# Patient Record
Sex: Female | Born: 1955 | Race: Asian | Hispanic: No | Marital: Married | Smoking: Never smoker
Health system: Southern US, Community
[De-identification: ages and names within clinical notes are randomized; demographics above are authoritative.]

## PROBLEM LIST (undated history)

## (undated) DIAGNOSIS — E785 Hyperlipidemia, unspecified: Secondary | ICD-10-CM

## (undated) DIAGNOSIS — K219 Gastro-esophageal reflux disease without esophagitis: Secondary | ICD-10-CM

## (undated) DIAGNOSIS — D649 Anemia, unspecified: Secondary | ICD-10-CM

## (undated) DIAGNOSIS — F419 Anxiety disorder, unspecified: Secondary | ICD-10-CM

---

## 1998-07-16 ENCOUNTER — Encounter: Admission: RE | Admit: 1998-07-16 | Discharge: 1998-08-16 | Payer: Self-pay | Admitting: *Deleted

## 1999-11-10 ENCOUNTER — Emergency Department (HOSPITAL_COMMUNITY): Admission: EM | Admit: 1999-11-10 | Discharge: 1999-11-10 | Payer: Self-pay | Admitting: Emergency Medicine

## 2001-08-26 ENCOUNTER — Encounter: Admission: RE | Admit: 2001-08-26 | Discharge: 2001-08-26 | Payer: Self-pay | Admitting: Gastroenterology

## 2001-08-26 ENCOUNTER — Encounter: Payer: Self-pay | Admitting: Gastroenterology

## 2001-10-19 ENCOUNTER — Encounter (INDEPENDENT_AMBULATORY_CARE_PROVIDER_SITE_OTHER): Payer: Self-pay | Admitting: Specialist

## 2001-10-19 ENCOUNTER — Ambulatory Visit (HOSPITAL_COMMUNITY): Admission: RE | Admit: 2001-10-19 | Discharge: 2001-10-19 | Payer: Self-pay | Admitting: Gastroenterology

## 2002-10-02 ENCOUNTER — Emergency Department (HOSPITAL_COMMUNITY): Admission: EM | Admit: 2002-10-02 | Discharge: 2002-10-02 | Payer: Self-pay

## 2004-10-24 ENCOUNTER — Emergency Department (HOSPITAL_COMMUNITY): Admission: EM | Admit: 2004-10-24 | Discharge: 2004-10-24 | Payer: Self-pay | Admitting: Emergency Medicine

## 2004-11-18 ENCOUNTER — Ambulatory Visit (HOSPITAL_COMMUNITY): Admission: RE | Admit: 2004-11-18 | Discharge: 2004-11-18 | Payer: Self-pay | Admitting: Gastroenterology

## 2004-11-18 ENCOUNTER — Encounter (INDEPENDENT_AMBULATORY_CARE_PROVIDER_SITE_OTHER): Payer: Self-pay | Admitting: Specialist

## 2005-02-09 ENCOUNTER — Emergency Department (HOSPITAL_COMMUNITY): Admission: EM | Admit: 2005-02-09 | Discharge: 2005-02-09 | Payer: Self-pay | Admitting: Emergency Medicine

## 2005-03-10 ENCOUNTER — Ambulatory Visit (HOSPITAL_COMMUNITY): Admission: RE | Admit: 2005-03-10 | Discharge: 2005-03-10 | Payer: Self-pay | Admitting: Internal Medicine

## 2005-04-07 ENCOUNTER — Ambulatory Visit: Admission: RE | Admit: 2005-04-07 | Discharge: 2005-04-07 | Payer: Self-pay | Admitting: Thoracic Surgery

## 2005-04-09 ENCOUNTER — Ambulatory Visit: Payer: Self-pay | Admitting: Internal Medicine

## 2006-06-15 ENCOUNTER — Emergency Department (HOSPITAL_COMMUNITY): Admission: EM | Admit: 2006-06-15 | Discharge: 2006-06-15 | Payer: Self-pay | Admitting: Emergency Medicine

## 2007-07-23 ENCOUNTER — Emergency Department (HOSPITAL_COMMUNITY): Admission: EM | Admit: 2007-07-23 | Discharge: 2007-07-23 | Payer: Self-pay | Admitting: Family Medicine

## 2007-12-16 ENCOUNTER — Encounter: Admission: RE | Admit: 2007-12-16 | Discharge: 2007-12-16 | Payer: Self-pay | Admitting: Family Medicine

## 2009-01-17 ENCOUNTER — Encounter: Admission: RE | Admit: 2009-01-17 | Discharge: 2009-01-17 | Payer: Self-pay | Admitting: Internal Medicine

## 2009-02-13 ENCOUNTER — Encounter: Admission: RE | Admit: 2009-02-13 | Discharge: 2009-03-01 | Payer: Self-pay | Admitting: Internal Medicine

## 2009-03-12 ENCOUNTER — Emergency Department (HOSPITAL_COMMUNITY): Admission: EM | Admit: 2009-03-12 | Discharge: 2009-03-12 | Payer: Self-pay | Admitting: Emergency Medicine

## 2010-03-23 ENCOUNTER — Encounter (HOSPITAL_BASED_OUTPATIENT_CLINIC_OR_DEPARTMENT_OTHER): Payer: Self-pay | Admitting: Internal Medicine

## 2010-03-24 ENCOUNTER — Encounter: Payer: Self-pay | Admitting: Orthopedic Surgery

## 2010-05-18 LAB — DIFFERENTIAL
Basophils Absolute: 0 10*3/uL (ref 0.0–0.1)
Basophils Relative: 0 % (ref 0–1)
Lymphocytes Relative: 29 % (ref 12–46)
Lymphs Abs: 2.4 10*3/uL (ref 0.7–4.0)
Monocytes Absolute: 0.5 10*3/uL (ref 0.1–1.0)
Monocytes Relative: 6 % (ref 3–12)
Neutro Abs: 5.1 10*3/uL (ref 1.7–7.7)
Neutrophils Relative %: 61 % (ref 43–77)

## 2010-05-18 LAB — COMPREHENSIVE METABOLIC PANEL
ALT: 124 U/L — ABNORMAL HIGH (ref 0–35)
AST: 67 U/L — ABNORMAL HIGH (ref 0–37)
Albumin: 4.2 g/dL (ref 3.5–5.2)
Alkaline Phosphatase: 174 U/L — ABNORMAL HIGH (ref 39–117)
CO2: 28 mEq/L (ref 19–32)
Calcium: 9.8 mg/dL (ref 8.4–10.5)
Chloride: 103 mEq/L (ref 96–112)
Glucose, Bld: 113 mg/dL — ABNORMAL HIGH (ref 70–99)
Potassium: 3.8 mEq/L (ref 3.5–5.1)

## 2010-05-18 LAB — URINALYSIS, ROUTINE W REFLEX MICROSCOPIC
Glucose, UA: NEGATIVE mg/dL
Hgb urine dipstick: NEGATIVE
Protein, ur: NEGATIVE mg/dL
pH: 7.5 (ref 5.0–8.0)

## 2010-05-18 LAB — CBC
MCHC: 34.7 g/dL (ref 30.0–36.0)
MCV: 95.1 fL (ref 78.0–100.0)
WBC: 8.3 10*3/uL (ref 4.0–10.5)

## 2010-05-18 LAB — LIPASE, BLOOD: Lipase: 60 U/L — ABNORMAL HIGH (ref 11–59)

## 2010-07-18 NOTE — Op Note (Signed)
Emma Taylor, DUIGNAN             ACCOUNT NO.:  [id number]   MEDICAL RECORD NO.:  [id number]          PATIENT TYPE:  AMB   LOCATION:  ENDO                         FACILITY:  [facility]   PHYSICIAN:  Petra Kuba, M.D.    DATE OF BIRTH:  [birth year]   DATE OF PROCEDURE:  11/18/2004  DATE OF DISCHARGE:                                 OPERATIVE REPORT   PROCEDURE:  Colonoscopy.   INDICATIONS:  Iron deficiency.  Due for colonic screening.   Consent was signed after risks, benefits, methods, and options thoroughly  discussed in the office.   No sedation was used, based on patient preference.   PROCEDURE:  Rectal inspection is pertinent for external hemorrhoids, small.  Digital exam was negative.  The video pediatric adjustable colonoscope was  inserted and was fairly easily advanced around the colon to the cecum.  This  did require some abdominal pressure but no position changes.  Other than  some left-sided diverticula, no abnormalities were seen on insertion.  The  cecum was identified by the appendiceal orifice and the ileocecal valve.  The scope was inserted shortways into the terminal ileum, which was normal.  Photo documentation was obtained.  No blood was seen on insertion or in the  GI.  The scope was slowly withdrawn.  The prep was adequate.  There was some  liquid stool that required washing and suctioning.  On slow withdrawal  through the colon, other than some left-sided diverticula and some  tortuosity, no polypoid lesions, masses, AVM's, or other abnormalities were  seen as we slowly withdrew back to the rectum.  Anorectal pull-through and  retroflexion confirmed some small hemorrhoids.  The scope was straightened  and readvanced shortways up the left side of the colon.  Air was suctioned.  The scope removed.  The patient tolerated the procedure well.  There was no  obvious immediate complication.   ENDOSCOPIC DIAGNOSES:  1.  Internal/external hemorrhoids, small.  2.   Left-sided diverticula tortuosity.  3.  Otherwise within normal limits to the terminal ileum without any blood      being seen.   PLAN:  Continue workup with an EGD.  Recheck colon screening in five years.           ______________________________  Petra Kuba, M.D.     MEM/MEDQ  D:  11/18/2004  T:  11/18/2004  Job:  [id number]   cc:   Gabriel Earing, M.D.  [address]  [place]  [place] [address]  Fax: [phone]

## 2010-07-18 NOTE — Op Note (Signed)
[name], [name]             ACCOUNT NO.:  [id number]   MEDICAL RECORD NO.:  [id number]           PATIENT TYPE:   LOCATION:                                 FACILITY:   PHYSICIAN:  Petra Kuba, M.D.         DATE OF BIRTH:   DATE OF PROCEDURE:  DATE OF DISCHARGE:                                 OPERATIVE REPORT   PROCEDURE:  Esophagogastroduodenoscopy with polypectomy.   ENDOSCOPIST:  Petra Kuba, M.D.   INDICATIONS:  Patient with iron deficiency, nondiagnostic colonoscopy  history of gastric polyp.  No  medicines were given per patient preference.  Consent was signed after risks, benefits, methods, options thoroughly  discussed in the office.   DESCRIPTION OF PROCEDURE:  The video endoscope was inserted by direct  vision.  The esophagus was normal. She did have a small to medium sized  hiatal hernia.  The scope passed into the stomach and probably some atrophic  gastritis was seen along the distal greater curve almost to the antrum.  Two  moderate size pedunculated polyps were seen.  It did seem to have ulcerated  tend to the scope was inserted through the normal antrum, normal pylorus  into a normal duodenal bulb and around the C-loop to a normal second portion  of the duodenum.  We went ahead and took two biopsies of the duodenum to  rule out any malabsorption problem.  The scope was then withdrawn slowly  back to the bulb again confirming its normal appearance.  The scope was  withdrawn back to the stomach and retroflexed.  The  cardia, fundus,  angularis, lesser and greater curve were all normal on retroflexed  visualization except for some mild atrophic gastritis.  Straight  visualization of the stomach did not reveal any additional findings.  We  went ahead and withdrew the scope back to about 20 cm, which again confirmed  a normal esophagus.  We went ahead and advance the scope to the polyp, and  we did try to use the Endoloop but unfortunately the Endoloop was  malfunctioning, and we elected not to use it.  She did pulled the scope out  once at this juncture, which required reintubation.  She had minimal  difficulty holding air but we asked her at this juncture to hold the air.  The polyp was very carefully snared.  Electrocautery was applied, and the  polyp was removed.  We went ahead and used the [facility] retrieval mitt to grab  the polyp, and the polyp and scope were removed, and the polyp was  recovered.  We reinserted the scope.  The polypectomy site had a nice white  coagulum but it did not seem to be too deep.  We went ahead and put two  clips on the polypectomy site to try to prevent bleeding down the road.  This was done using the two prong clip in the customary fashion without  problems.  The air was suctioned, scope slowly withdrawn. The patient  tolerated the procedure well.  There was no obvious immediate complication.   ENDOSCOPIC DIAGNOSES:  1.  Small to medium hiatal hernia.  2.  Distal greater curve moderate ulcerated polyp status post snare x1 with      seemingly complete removal and empiric clip of the base x2.  3.  Otherwise normal EGD status post duodenal biopsies except for some      possible mild atrophic gastritis.   PLAN:  1.  Await pathology.  2.  No aspirin or nonsteroidals.  3.  Recheck outpatient guaiacs in one month as well as probably repeat a CBC      at that junction.  4.  Continue her iron in the meantime and no aspirin or nonsteroidals and      use Prilosec OTC.  5.  Call me sooner p.r.n.           ______________________________  Petra Kuba, M.D.     MEM/MEDQ  D:  11/18/2004  T:  11/18/2004  Job:  [id number]   cc:   Emma Taylor., M.D.  [address], [address]  [place]  [place] [address]  Fax: [phone]

## 2010-07-18 NOTE — Op Note (Signed)
   TNAMETIMEA, BREED                 ACCOUNT NO.:  [id number]   MEDICAL RECORD NO.:  [id number]                   PATIENT TYPE:  AMB   LOCATION:  ENDO                                 FACILITY:  [facility]   PHYSICIAN:  Petra Kuba, M.D.                 DATE OF BIRTH:  Oct 05, 1955   DATE OF PROCEDURE:  DATE OF DISCHARGE:                                 OPERATIVE REPORT   PROCEDURE:  Esophagogastroduodenoscopy with biopsy.   INDICATIONS FOR PROCEDURE:  Upper tract symptoms not responding to the usual  medicine.  Consent was signed after risks, benefits, methods, and options  were thoroughly discussed in the office.   No sedation was given per patient request.   DESCRIPTION OF PROCEDURE:  The video endoscope was inserted by direct  vision. The esophagus was normal. In the distal esophagus was a tiny hiatal  hernia. The scope passed into the stomach and advanced through the antrum  pertinent for some minimal antritis through a normal pylorus into a normal  duodenal and around the C loop to a normal second portion of the duodenum.  The scope was withdrawn back to the bulb and a good look there ruled out  ulcers in that location. The scope was withdrawn back to the stomach and  retroflexed. The cardia, fundus, proximal, lesser and greater curve were  normal as was the angularis. The scope was straightened and straight  visualization on the distal greater curve was a small pedunculated polyp  which was cold biopsied multiple times. We also took a few biopsies of the  antrum to rule out Helicobacter. Air was suctioned, scope slowly withdrawn.  Again a good look at the esophagus was normal except for the tiny hiatal  hernia. The scope was removed. The patient tolerated the procedure well.  There was no obvious or immediate complication.   ENDOSCOPIC DIAGNOSIS:  1. Tiny hiatal hernia.  2. Minimal antritis status post biopsy.  3. Greater curve small pedunculated polyp status post  biopsy.  4. Otherwise within normal limits EGD.   PLAN:  B.I.D. Protonix since that seems to be helping. Await pathology but  as long as nothing worrisome consider repeat endoscopy either in one year or  proceed with polypectomy and otherwise follow-up p.r.n. or probably in two  months to recheck symptoms and make sure no further workup plans are needed.  Based on not having her office chart with me, I will need to send a copy of  this to her primary care physician when I have that chart.                                               Petra Kuba, M.D.    MEM/MEDQ  D:  10/19/2001  T:  10/19/2001  Job:  [id number]

## 2010-07-28 ENCOUNTER — Emergency Department (HOSPITAL_COMMUNITY)
Admission: EM | Admit: 2010-07-28 | Discharge: 2010-07-29 | Disposition: A | Discharge status: Home / Self Care | Payer: 59 | Attending: Emergency Medicine | Admitting: Emergency Medicine

## 2010-07-28 DIAGNOSIS — IMO0001 Reserved for inherently not codable concepts without codable children: Secondary | ICD-10-CM | POA: Insufficient documentation

## 2010-07-28 DIAGNOSIS — S90569A Insect bite (nonvenomous), unspecified ankle, initial encounter: Secondary | ICD-10-CM | POA: Insufficient documentation

## 2015-06-27 DIAGNOSIS — R42 Dizziness and giddiness: Secondary | ICD-10-CM | POA: Diagnosis not present

## 2015-06-27 DIAGNOSIS — Z0000 Encounter for general adult medical examination without abnormal findings: Secondary | ICD-10-CM | POA: Diagnosis not present

## 2015-06-27 DIAGNOSIS — E785 Hyperlipidemia, unspecified: Secondary | ICD-10-CM | POA: Diagnosis not present

## 2015-06-27 DIAGNOSIS — Z862 Personal history of diseases of the blood and blood-forming organs and certain disorders involving the immune mechanism: Secondary | ICD-10-CM | POA: Diagnosis not present

## 2015-07-13 DIAGNOSIS — E785 Hyperlipidemia, unspecified: Secondary | ICD-10-CM | POA: Diagnosis not present

## 2015-09-30 DIAGNOSIS — H5213 Myopia, bilateral: Secondary | ICD-10-CM | POA: Diagnosis not present

## 2015-09-30 DIAGNOSIS — H524 Presbyopia: Secondary | ICD-10-CM | POA: Diagnosis not present

## 2016-01-06 DIAGNOSIS — R945 Abnormal results of liver function studies: Secondary | ICD-10-CM | POA: Diagnosis not present

## 2016-01-06 DIAGNOSIS — Z13220 Encounter for screening for lipoid disorders: Secondary | ICD-10-CM | POA: Diagnosis not present

## 2016-01-06 DIAGNOSIS — D509 Iron deficiency anemia, unspecified: Secondary | ICD-10-CM | POA: Diagnosis not present

## 2016-04-06 MED FILL — NYSTATIN 100,000 UNITS/ML S: 100000 | 3 days supply | Qty: 60 | Fill #0

## 2016-04-13 MED FILL — NYSTATIN 100,000 UNITS/ML S: 100000 | 23 days supply | Qty: 473 | Fill #0

## 2016-05-11 DIAGNOSIS — K317 Polyp of stomach and duodenum: Secondary | ICD-10-CM | POA: Diagnosis not present

## 2016-05-11 DIAGNOSIS — R945 Abnormal results of liver function studies: Secondary | ICD-10-CM | POA: Diagnosis not present

## 2016-05-11 DIAGNOSIS — K219 Gastro-esophageal reflux disease without esophagitis: Secondary | ICD-10-CM | POA: Diagnosis not present

## 2016-05-11 DIAGNOSIS — Z0000 Encounter for general adult medical examination without abnormal findings: Secondary | ICD-10-CM | POA: Diagnosis not present

## 2016-05-11 DIAGNOSIS — E785 Hyperlipidemia, unspecified: Secondary | ICD-10-CM | POA: Diagnosis not present

## 2016-05-25 DIAGNOSIS — Z1211 Encounter for screening for malignant neoplasm of colon: Secondary | ICD-10-CM | POA: Diagnosis not present

## 2016-05-25 DIAGNOSIS — Z1212 Encounter for screening for malignant neoplasm of rectum: Secondary | ICD-10-CM | POA: Diagnosis not present

## 2016-06-17 DIAGNOSIS — H524 Presbyopia: Secondary | ICD-10-CM | POA: Diagnosis not present

## 2016-06-17 DIAGNOSIS — H5213 Myopia, bilateral: Secondary | ICD-10-CM | POA: Diagnosis not present

## 2016-07-21 DIAGNOSIS — R1013 Epigastric pain: Secondary | ICD-10-CM | POA: Diagnosis not present

## 2016-07-23 DIAGNOSIS — R29898 Other symptoms and signs involving the musculoskeletal system: Secondary | ICD-10-CM | POA: Diagnosis not present

## 2016-08-17 DIAGNOSIS — Z1151 Encounter for screening for human papillomavirus (HPV): Secondary | ICD-10-CM | POA: Diagnosis not present

## 2016-08-17 DIAGNOSIS — Z01419 Encounter for gynecological examination (general) (routine) without abnormal findings: Secondary | ICD-10-CM | POA: Diagnosis not present

## 2016-08-17 DIAGNOSIS — Z1231 Encounter for screening mammogram for malignant neoplasm of breast: Secondary | ICD-10-CM | POA: Diagnosis not present

## 2016-08-17 DIAGNOSIS — Z6820 Body mass index (BMI) 20.0-20.9, adult: Secondary | ICD-10-CM | POA: Diagnosis not present

## 2016-10-14 ENCOUNTER — Other Ambulatory Visit: Payer: Self-pay | Admitting: Cardiology

## 2016-10-14 ENCOUNTER — Emergency Department (HOSPITAL_COMMUNITY): Payer: 59

## 2016-10-14 ENCOUNTER — Emergency Department (HOSPITAL_COMMUNITY)
Admission: EM | Admit: 2016-10-14 | Discharge: 2016-10-14 | Disposition: A | Discharge status: Home / Self Care | Payer: 59 | Attending: Emergency Medicine | Admitting: Emergency Medicine

## 2016-10-14 ENCOUNTER — Encounter (HOSPITAL_COMMUNITY): Payer: Self-pay | Admitting: *Deleted

## 2016-10-14 DIAGNOSIS — R0602 Shortness of breath: Secondary | ICD-10-CM | POA: Diagnosis not present

## 2016-10-14 DIAGNOSIS — R9431 Abnormal electrocardiogram [ECG] [EKG]: Secondary | ICD-10-CM | POA: Diagnosis not present

## 2016-10-14 DIAGNOSIS — R079 Chest pain, unspecified: Secondary | ICD-10-CM | POA: Diagnosis not present

## 2016-10-14 DIAGNOSIS — R51 Headache: Secondary | ICD-10-CM | POA: Diagnosis not present

## 2016-10-14 DIAGNOSIS — F419 Anxiety disorder, unspecified: Secondary | ICD-10-CM | POA: Diagnosis not present

## 2016-10-14 DIAGNOSIS — E782 Mixed hyperlipidemia: Secondary | ICD-10-CM | POA: Diagnosis not present

## 2016-10-14 DIAGNOSIS — Z862 Personal history of diseases of the blood and blood-forming organs and certain disorders involving the immune mechanism: Secondary | ICD-10-CM | POA: Diagnosis not present

## 2016-10-14 DIAGNOSIS — R072 Precordial pain: Secondary | ICD-10-CM | POA: Insufficient documentation

## 2016-10-14 DIAGNOSIS — R519 Headache, unspecified: Secondary | ICD-10-CM

## 2016-10-14 DIAGNOSIS — E785 Hyperlipidemia, unspecified: Secondary | ICD-10-CM | POA: Insufficient documentation

## 2016-10-14 DIAGNOSIS — R0789 Other chest pain: Secondary | ICD-10-CM

## 2016-10-14 DIAGNOSIS — Z79899 Other long term (current) drug therapy: Secondary | ICD-10-CM | POA: Insufficient documentation

## 2016-10-14 HISTORY — DX: Anxiety disorder, unspecified: F41.9

## 2016-10-14 HISTORY — DX: Anemia, unspecified: D64.9

## 2016-10-14 HISTORY — DX: Gastro-esophageal reflux disease without esophagitis: K21.9

## 2016-10-14 HISTORY — DX: Hyperlipidemia, unspecified: E78.5

## 2016-10-14 LAB — BASIC METABOLIC PANEL
ANION GAP: 11 (ref 5–15)
BUN: 9 mg/dL (ref 6–20)
CALCIUM: 9.7 mg/dL (ref 8.9–10.3)
CHLORIDE: 103 mmol/L (ref 101–111)
CO2: 27 mmol/L (ref 22–32)
CREATININE: 0.63 mg/dL (ref 0.44–1.00)
GFR calc non Af Amer: >60 mL/min (ref >60)
Glucose, Bld: 187 mg/dL — ABNORMAL HIGH (ref 65–99)
Potassium: 3.9 mmol/L (ref 3.5–5.1)
SODIUM: 141 mmol/L (ref 135–145)

## 2016-10-14 LAB — HEPATIC FUNCTION PANEL
ALBUMIN: 4.3 g/dL (ref 3.5–5.0)
ALT: 28 U/L (ref 14–54)
AST: 35 U/L (ref 15–41)
Alkaline Phosphatase: 74 U/L (ref 38–126)
Bilirubin, Direct: <0.1 mg/dL — ABNORMAL LOW (ref 0.1–0.5)
TOTAL PROTEIN: 7.6 g/dL (ref 6.5–8.1)
Total Bilirubin: 1.3 mg/dL — ABNORMAL HIGH (ref 0.3–1.2)

## 2016-10-14 LAB — CBC
HCT: 44.2 % (ref 36.0–46.0)
HEMOGLOBIN: 14.6 g/dL (ref 12.0–15.0)
MCH: 31.1 pg (ref 26.0–34.0)
MCHC: 33 g/dL (ref 30.0–36.0)
MCV: 94.2 fL (ref 78.0–100.0)
PLATELETS: 281 10*3/uL (ref 150–400)
RBC: 4.69 MIL/uL (ref 3.87–5.11)
RDW: 11.5 % (ref 11.5–15.5)
WBC: 6.6 10*3/uL (ref 4.0–10.5)

## 2016-10-14 LAB — I-STAT TROPONIN, ED
TROPONIN I, POC: 0 ng/mL (ref 0.00–0.08)
Troponin i, poc: 0 ng/mL (ref 0.00–0.08)

## 2016-10-14 LAB — LIPASE, BLOOD: LIPASE: 59 U/L — AB (ref 11–51)

## 2016-10-14 MED ORDER — ASPIRIN 81 MG PO CHEW: 81.0000 mg | CHEWABLE_TABLET | Freq: Every day | ORAL | 0 refills | Status: AC

## 2016-10-14 MED ORDER — PANTOPRAZOLE SODIUM 20 MG PO TBEC: 20.0000 mg | DELAYED_RELEASE_TABLET | Freq: Every day | ORAL | 0 refills | Status: AC

## 2016-10-14 MED ORDER — ASPIRIN 81 MG PO CHEW
324.0000 mg | CHEWABLE_TABLET | Freq: Once | ORAL | Status: AC
  Administered 2016-10-14: 324 mg via ORAL
  Filled 2016-10-14: qty 4

## 2016-10-14 NOTE — Consult Note (Signed)
Cardiology Consult:   Patient ID: Emma Taylor; MRN: [id number]; DOB: May 31, 1955   Admission date: 10/14/2016  Primary Care Provider: Marden Noble, MD Primary Cardiologist: New   Chief Complaint:  Chest pain  Patient Profile:    Emma Taylor is a 61 y.o. female who is being seen today for the evaluation of chest pain at the request of Dr. Donnald Garre. Emma Taylor has a history of hyperlipidemia treated with red yeast rice, anemia, GERD and anxiety. Emma Taylor presented to [facility] urgent care for evaluation of chest pain over the last 2 weeks and was referred to [facility] ED for further evaluation.   History of Present Illness:   Emma Taylor first noted chest pain that is substernal pressure, nonradiating and associated with numbness of all 4 extremities about 2 weeks ago. The chest pain is constant with periods of increased intensity that last for a few hours. Emma Taylor has no palpitations, diaphoresis, shortness of breath. Emma Taylor does have weakness, occasional lightheadedness and occasional loss of balance. Emma Taylor can push on her sternal borders and reproduce the pain. Emma Taylor also has increased discomfort with leaning forward. Emma Taylor noted that last Tuesday Emma Taylor developed a panicky feeling and her friend calmed her down by helping her take deep breaths. Emma Taylor has a history of panic attacks but has not had any in the last 10 years. The numbness of her extremities, weakness and panicky feeling is similar to her previous panic attacks except for the new chest pain. Her discomfort is not necessarily related to exertion and Emma Taylor denies any orthopnea, PND or edema. Last night Emma Taylor again had the panicky feeling and did not sleep well. Emma Taylor presented to urgent care today for evaluation.   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 Emma Taylor does not like driving in the traffic and Emma Taylor is very stressed about  this.  Patient is a non-smoker and does not drink alcohol. Emma Taylor has no history of hypertension or diabetes. Emma Taylor has no known cardiac history. Emma Taylor has no significant family history of CAD. Emma Taylor does have a fairly strong family history of cancer.  Significant findings: Troponin 0.00,  0.00 EKG: NSR 82 BPM, non-specific ST changes esp laterally, Prolonged QTC 551 SCr 0.63,  K+  3.9 Hgb 14.6,   WBC 6.6 CXR: no edema or consolidation  Past Medical History:  Diagnosis Date  . Anemia   . Anxiety   . GERD (gastroesophageal reflux disease)   . Hyperlipidemia     Past Surgical History:  Procedure Laterality Date  . CESAREAN SECTION       Medications Prior to Admission: Prior to Admission medications   Medication Sig Start Date End Date Taking? Authorizing Provider  cholecalciferol (VITAMIN D) 1000 units tablet Take 1,000 Units by mouth daily.   Yes [provider]  magnesium 30 MG tablet Take 30 mg by mouth 2 (two) times daily.   Yes [provider]  vitamin C (ASCORBIC ACID) 500 MG tablet Take 500 mg by mouth daily.   Yes [provider]     Allergies:    Allergies  Allergen Reactions  . Iohexol Other (See Comments)     Desc: pt states Emma Taylor had reaction to contrast media in past, states Emma Taylor had "cardiac overload" and near syncope.     Social History:   Social History   Social History  . Marital status: Married  Spouse name: N/A  . Number of children: N/A  . Years of education: N/A   Occupational History  . IV nurse [employer]   Social History Main Topics  . Smoking status: Never Smoker  . Smokeless tobacco: Never Used  . Alcohol use No  . Drug use: No  . Sexual activity: Not on file   Other Topics Concern  . Not on file   Social History Narrative  . No narrative on file    Family History:   The patient's family history includes Alzheimer's disease in her mother; Breast cancer in her sister; Diabetes in her mother; Hypertension in her  mother; Lung cancer in her sister; Pancreatic cancer in her brother; Stomach cancer in her father; Stroke in her sister.    ROS:  Please see the history of present illness.  All other ROS reviewed and negative.     Physical Exam/Data:   Vitals:   10/14/16 1115 10/14/16 1145 10/14/16 1445 10/14/16 1500  BP: (!) 177/99 115/79 (!) 128/45 135/81  Pulse: 77 64 70 75  Resp: 20 16 19 17   Temp:      TempSrc:      SpO2: 100% 98% 100% 98%  Weight:      Height:       No intake or output data in the 24 hours ending 10/14/16 1531 Filed Weights   10/14/16 1010  Weight: 97 lb (44 kg)   Body mass index is 20.27 kg/m.  General:  Well nourished, well developed, in no acute distress HEENT: normal Lymph: no adenopathy Neck: no JVD Endocrine:  No thryomegaly Vascular: No carotid bruits; FA pulses 2+ bilaterally without bruits  Cardiac:  normal S1, S2; RRR; no murmur  Lungs:  clear to auscultation bilaterally, no wheezing, rhonchi or rales  Abd: soft, nontender, no hepatomegaly  Ext: no edema Musculoskeletal:  No deformities, BUE and BLE strength normal and equal Skin: warm and dry  Neuro:  CNs 2-12 intact, no focal abnormalities noted Psych:  Normal affect    EKG:  The ECG that was done was personally reviewed and demonstrates  NSR 82 BPM, non-specific ST changes esp laterally, Prolonged QTC 551- QT does not look that long, looks like around 400 EKG done at urgent care reviewed: NSR at 63. No ST changes. QTC 413  Relevant CV Studies: none  Laboratory Data:  Chemistry  Recent Labs Lab 10/14/16 1014  NA 141  K 3.9  CL 103  CO2 27  GLUCOSE 187*  BUN 9  CREATININE 0.63  CALCIUM 9.7  GFRNONAA >60  GFRAA >60  ANIONGAP 11     Recent Labs Lab 10/14/16 1014  PROT 7.6  ALBUMIN 4.3  AST 35  ALT 28  ALKPHOS 74  BILITOT 1.3*   Hematology  Recent Labs Lab 10/14/16 1014  WBC 6.6  RBC 4.69  HGB 14.6  HCT 44.2  MCV 94.2  MCH 31.1  MCHC 33.0  RDW 11.5  PLT 281    Cardiac EnzymesNo results for input(s): TROPONINI in the last 168 hours.   Recent Labs Lab 10/14/16 1046 10/14/16 1418  TROPIPOC 0.00 0.00    BNPNo results for input(s): BNP, PROBNP in the last 168 hours.  DDimer No results for input(s): DDIMER in the last 168 hours.  Radiology/Studies:  Dg Chest 2 View  Result Date: 10/14/2016 CLINICAL DATA:  Chest pain ; shortness of breath EXAM: CHEST  2 VIEW COMPARISON:  June 05, 2006 FINDINGS: Lungs are clear. Heart size and pulmonary  vascularity are normal. No adenopathy. No pneumothorax. No bone lesions. IMPRESSION: No edema or consolidation. Electronically Signed   By: Bretta Bang III M.D.   On: 10/14/2016 11:06    Assessment and Plan:   1. Atypical Chest pain -Chest pain is atypical, non-radiating, reproducible, also having extremity numbness and panicky feelings. -Troponins negative X2 -EKG is with non-specific ST changes. EKG at urgent care shows NSR without ST changes, QTC 413 -CXR normal -CVD risk factors include only hyperlipidemia. -With no objective ischemia and low suspicion for ACS, offered pt a Cardiac CT for calcium scoring and morphology with FFR, however, Emma Taylor apparently had a reaction to IV contrast many years ago and does not want this test. Emma Taylor does agree to have an outpatient exercise nuclear stress test. I will arrange e this through the office.   2. Hyperlipidemia -Lipid panel in 07/2015 TC-202, LDL-136, HDL 51, Trig- 72, Non-HDL -151   Severity of Illness: ER consult.   Signed, Berton Bon, NP  10/14/2016 3:31 PM   The patient was seen, examined and discussed with Berton Bon, NP-C and I agree with the above.    61 y.o. female With past medical history of hyperlipidemia we are seeing today for evaluation of chest pain. Patient has no family history of premature coronary artery disease no prior cardiac history herself Emma Taylor has never smoked and doesn't drink. Emma Taylor has been using red yeast rice for  hyperlipidemia, Emma Taylor also has history of gastric reflux and anxiety. Emma Taylor presented to the urgent care with chest pain that started about 2 weeks ago it has been persistent and reproducible on the left side of her chest. Sometimes Emma Taylor would wake up at night and feel like her jaw is hurting as well. Her pain is not worse on exertion and he doesn't get relief at rest. Emma Taylor has no diaphoresis no palpitations or syncope. Emma Taylor denies any orthopnea or lower extremity edema. Her EKG shows sinus bradycardia and is otherwise normal. Troponin negative 2, her physical exam is unremarkable with normal JVD is regular rate and rhythm and clear lungs and no peripheral edema which strong pulses. Considering Emma Taylor is low to intermediate risk, Emma Taylor was offered to undergo coronary CTA, however Emma Taylor [place] push injection that lead to syncopal episode and Emma Taylor is anxious to get anything IV since then. Emma Taylor doesn't wish to have CT scan done. Emma Taylor is offered to do a stress test, Emma Taylor doesn't want to stay overnight and makes it clear that if Emma Taylor had a stress test has to be an exercise one as Emma Taylor doesn't want to be injected Lexiscan. Emma Taylor wishes to go home tonight, Emma Taylor'll be discharged from the ER, we will arrange for an outpatient exercise nuclear stress test in our office.  Tobias Alexander, MD 10/14/2016

## 2016-10-14 NOTE — ED Notes (Signed)
Upon discharge, pt requesting CT scan of her head due to continued headaches and family hx of strokes and brain tumor.

## 2016-10-14 NOTE — ED Notes (Signed)
Pt resting; fresh warm blankets given; no needs.

## 2016-10-14 NOTE — ED Provider Notes (Signed)
[facility]-EMERGENCY DEPT Provider Note   CSN: [id number] Arrival date & time: 10/14/16  [id number]     History   Chief Complaint Chief Complaint  Patient presents with  . Chest Pain  . Shortness of Breath    HPI Emma Taylor is a 61 y.o. female.  HPI Patient has been getting intermittent chest pain for about 2 weeks. She reports at onset she experienced chest pain with achiness in her jaws. The pain has been waxing and waning and predominantly substernal. She does note some exertional dyspnea and fatigue. She particularly noted this morning when she awakened she had pain in the left upper chest that was radiating toward the shoulder with associated shortness of breath. Patient does work as an Advertising copywriterV nurse at the hospital. She was spending the past 2 weeks "observing" the quality of pain to determine if she thought it needed evaluation. This morning she did determined to go to urgent care for evaluation. She was referred to the emergency department for further chest pain workup. Patient has not had a peripheral edema or calf tenderness. No fever, no cough, no hemoptysis. No known history of cardiac disease. Patient reports that she has hypercholesterolemia that she treats with red yeast extract. Nonsmoker. Patient for she typically does not have hypertension, she thinks blood pressures are somewhat elevated emergency department due to anxiety.  Past Medical History:  Diagnosis Date  . Anemia   . Anxiety   . GERD (gastroesophageal reflux disease)   . Hyperlipidemia     There are no active problems to display for this patient.   Past Surgical History:  Procedure Laterality Date  . CESAREAN SECTION      OB History    No data available       Home Medications    Prior to Admission medications   Medication Sig Start Date End Date Taking? Authorizing Provider  cholecalciferol (VITAMIN D) 1000 units tablet Take 1,000 Units by mouth daily.   Yes [provider]  magnesium  30 MG tablet Take 30 mg by mouth 2 (two) times daily.   Yes [provider]  vitamin C (ASCORBIC ACID) 500 MG tablet Take 500 mg by mouth daily.   Yes [provider]  aspirin 81 MG chewable tablet Chew 1 tablet (81 mg total) by mouth daily. 10/14/16   Arby BarrettePfeiffer, Lakeisha Waldrop, MD  pantoprazole (PROTONIX) 20 MG tablet Take 1 tablet (20 mg total) by mouth daily. 10/14/16   Arby BarrettePfeiffer, Xitlally Mooneyham, MD    Family History Family History  Problem Relation Age of Onset  . Alzheimer's disease Mother   . Diabetes Mother   . Hypertension Mother   . Stomach cancer Father   . Stroke Sister   . Lung cancer Sister   . Pancreatic cancer Brother   . Breast cancer Sister     Social History Social History  Substance Use Topics  . Smoking status: Never Smoker  . Smokeless tobacco: Never Used  . Alcohol use No     Allergies   Iohexol   Review of Systems Review of Systems 10 Systems reviewed and are negative for acute change except as noted in the HPI.   Physical Exam Updated Vital Signs BP 127/78   Pulse 68   Temp 98.1 F (36.7 C) (Oral)   Resp 16   Ht 4\' 10"  (1.473 m)   Wt 44 kg (97 lb)   SpO2 98%   BMI 20.27 kg/m   Physical Exam  Constitutional: She appears well-developed and  well-nourished. No distress.  HENT:  Head: Normocephalic and atraumatic.  Mouth/Throat: Oropharynx is clear and moist.  Eyes: Conjunctivae and EOM are normal.  Neck: Neck supple.  Cardiovascular: Normal rate, regular rhythm, normal heart sounds and intact distal pulses.   No murmur heard. Pulmonary/Chest: Effort normal and breath sounds normal. No respiratory distress.  Abdominal: Soft. She exhibits no distension. There is no tenderness. There is no guarding.  Musculoskeletal: Normal range of motion. She exhibits no edema or tenderness.  No calf tenderness and no peripheral edema. Skin condition very good.  Neurological: She is alert.  Skin: Skin is warm and dry.  Psychiatric: She has a normal mood  and affect.  Nursing note and vitals reviewed.    ED Treatments / Results  Labs (all labs ordered are listed, but only abnormal results are displayed) Labs Reviewed  BASIC METABOLIC PANEL - Abnormal; Notable for the following:       Result Value   Glucose, Bld 187 (*)    All other components within normal limits  HEPATIC FUNCTION PANEL - Abnormal; Notable for the following:    Total Bilirubin 1.3 (*)    Bilirubin, Direct <0.1 (*)    All other components within normal limits  LIPASE, BLOOD - Abnormal; Notable for the following:    Lipase 59 (*)    All other components within normal limits  CBC  I-STAT TROPONIN, ED  I-STAT TROPONIN, ED    EKG  EKG Interpretation  Date/Time:  Wednesday October 14 2016 10:00:20 EDT Ventricular Rate:  82 PR Interval:  142 QRS Duration: 86 QT Interval:  472 QTC Calculation: 551 R Axis:   69 Text Interpretation:  Critical Test Result: Long QTc Normal sinus rhythm Low voltage QRS Nonspecific ST and T wave abnormality Abnormal ECG Confirmed by Arby Barrette [phone]) on 10/14/2016 10:43:06 AM       Radiology Dg Chest 2 View  Result Date: 10/14/2016 CLINICAL DATA:  Chest pain ; shortness of breath EXAM: CHEST  2 VIEW COMPARISON:  June 05, 2006 FINDINGS: Lungs are clear. Heart size and pulmonary vascularity are normal. No adenopathy. No pneumothorax. No bone lesions. IMPRESSION: No edema or consolidation. Electronically Signed   By: Bretta Bang III M.D.   On: 10/14/2016 11:06    Procedures Procedures (including critical care time)  Medications Ordered in ED Medications  aspirin chewable tablet 324 mg (324 mg Oral Given 10/14/16 1115)     Initial Impression / Assessment and Plan / ED Course  I have reviewed the triage vital signs and the nursing notes.  Pertinent labs & imaging results that were available during my care of the patient were reviewed by me and considered in my medical decision making (see chart for details).      Consult: (11:25) cardiology for evaluation emergency department.  Final Clinical Impressions(s) / ED Diagnoses   Final diagnoses:  Precordial chest pain  Abnormal EKG  Acute nonintractable headache, unspecified headache type   Patient has been seen by cardiology. 2 sets of cardiac enzymes are negative. They have made an arrangement plan for the patient to get exercise stress test on Monday. I have reviewed with patient signs and symptoms first return. She is alert and nontoxic. Patient does however report prior to discharge that she has had a generalized headache today. She reports is atypical for her to have headaches. He has no associated neurologic symptoms. She is worried because she has had a friend and family member with headache have bad outcome. At  this time, will proceed with CT scan. CT negative plan is to discharge a patient with cardiology follow-up as arranged. New Prescriptions New Prescriptions   ASPIRIN 81 MG CHEWABLE TABLET    Chew 1 tablet (81 mg total) by mouth daily.   PANTOPRAZOLE (PROTONIX) 20 MG TABLET    Take 1 tablet (20 mg total) by mouth daily.     Arby Barrette, MD 10/14/16 [phone]

## 2016-10-14 NOTE — ED Triage Notes (Signed)
Pt sent here from [facility] for chest pain x 2 weeks.  Sternal chest pressure now radiates to L chest and is accompanied by sob.

## 2016-10-14 NOTE — ED Notes (Signed)
Pt ambulatory to restroom independently with no distress

## 2016-10-14 NOTE — ED Notes (Signed)
Pt ambulated to restroom without distress.  

## 2016-10-14 NOTE — ED Notes (Signed)
ED Provider at bedside. 

## 2016-10-14 NOTE — ED Notes (Signed)
Patient transported to X-ray 

## 2016-10-14 NOTE — ED Notes (Signed)
Warm blankets given; no needs; tucked in

## 2016-10-14 NOTE — ED Notes (Signed)
Cards at bedside. Pt reports headache starting this morning that is worsening. MD informed.

## 2016-10-14 NOTE — Discharge Instructions (Signed)
1. You have a stress test scheduled for Monday 10/19/16 at 9:45. See instructions below. 2. Take Protonix daily to protect her stomach from ulcer inflammation. Take a daily baby aspirin until you have completed your testing for heart related symptoms. 3. Return to the emergency department if her symptoms are worsening or new symptoms are developing.    You have a Stress Test scheduled at [facility]. Your doctor has ordered this test to check the blood flow in your heart arteries.  Please arrive 15 minutes early for paperwork. The whole test will take several hours. You may want to bring reading material to remain occupied while undergoing different parts of the test.  Instructions:  No food/drink after midnight the night before.  It is OK to take your morning meds with a sip of water EXCEPT for those types of medicines listed below or otherwise instructed.  No caffeine/decaf products 24 hours before, including medicines such as Excedrin or Goody Powders. Call if there are any questions.   Wear comfortable clothes and shoes.   Special Medication Instructions:  Beta blockers such as metoprolol (Lopressor/Toprol XL), atenolol (Tenormin), carvedilol (Coreg), nebivolol (Bystolic), bisoprolol (Zebeta), propranolol (Inderal) should not be taken for 24 hours before the test.  Calcium channel blockers such as diltiazem (Cardizem) or verapmil (Calan) should not be taken for 24 hours before the test.  Remove nitroglycerin patches and do not take nitrate preparations such as Imdur/isosorbide the day of your test.  No Persantine/Theophylline or Aggrenox medicines should be used within 24 hours of the test.   If you are diabetic, please ask which medications to hold the day of the test.  What To Expect: When you arrive in the lab, the technician will inject a small amount of radioactive tracer into your arm through an IV while you are resting quietly. This helps [place] to form  pictures of your heart. You will likely only feel a sting from the IV. After a waiting period, resting pictures will be obtained under a big camera. These are the "before" pictures.  Next, you will be prepped for the stress portion of the test. This may include either walking on a treadmill or receiving a medicine that helps to dilate blood vessels in your heart to simulate the effect of exercise on your heart. If you are walking on a treadmill, you will walk at different paces to try to get your heart rate to a goal number that is based on your age. If your doctor has chosen the pharmacologic test, then you will receive a medicine through your IV that may cause temporary nausea, flushing, shortness of breath and sometimes chest discomfort or vomiting. This is typically short-lived and usually resolves quickly. If you experience symptoms, that does not automatically mean the test is abnormal. Some patients do not experience any symptoms at all. Your blood pressure and heart rate will be monitored, and we will be watching your EKG on a computer screen for any changes. During this portion of the test, the radiologist will inject another small amount of radioactive tracer into your IV. After a waiting period, you will undergo a second set of pictures. These are the "after" pictures.  The doctor reading the test will compare the before-and-after images to look for evidence of heart blockages or heart weakness. The test usually takes 1 day to complete, but in certain instances (for example, if a patient is over a certain weight limit), the test may be done over the  span of 2 days.

## 2016-10-14 NOTE — ED Notes (Signed)
Rn called CT, 3 pts ahead of her

## 2016-10-15 ENCOUNTER — Telehealth (HOSPITAL_COMMUNITY): Payer: Self-pay | Admitting: *Deleted

## 2016-10-15 NOTE — Telephone Encounter (Signed)
Attempted to call patient regarding upcoming appointment on 10/19/16. No answer x 2.  Emma Taylor, Emma Taylor

## 2016-10-19 ENCOUNTER — Encounter (HOSPITAL_COMMUNITY): Payer: Self-pay | Admitting: *Deleted

## 2016-10-19 ENCOUNTER — Encounter (HOSPITAL_COMMUNITY): Payer: Self-pay

## 2016-10-19 ENCOUNTER — Ambulatory Visit (HOSPITAL_COMMUNITY): Payer: 59

## 2016-10-19 NOTE — Progress Notes (Signed)
Patient arrived for nuclear stress test. Refused test. Does not want to reschedule

## 2016-10-26 ENCOUNTER — Encounter: Payer: Self-pay | Admitting: Physician Assistant

## 2016-10-26 ENCOUNTER — Ambulatory Visit (INDEPENDENT_AMBULATORY_CARE_PROVIDER_SITE_OTHER): Payer: 59 | Admitting: Physician Assistant

## 2016-10-26 VITALS — BP 180/90 | HR 81 | Ht <= 58 in | Wt 97.8 lb

## 2016-10-26 DIAGNOSIS — I10 Essential (primary) hypertension: Secondary | ICD-10-CM | POA: Insufficient documentation

## 2016-10-26 DIAGNOSIS — R079 Chest pain, unspecified: Secondary | ICD-10-CM | POA: Diagnosis not present

## 2016-10-26 DIAGNOSIS — F419 Anxiety disorder, unspecified: Secondary | ICD-10-CM | POA: Diagnosis not present

## 2016-10-26 DIAGNOSIS — E785 Hyperlipidemia, unspecified: Secondary | ICD-10-CM

## 2016-10-26 DIAGNOSIS — K219 Gastro-esophageal reflux disease without esophagitis: Secondary | ICD-10-CM | POA: Diagnosis not present

## 2016-10-26 NOTE — Patient Instructions (Signed)
Medication Instructions:  Your physician recommends that you continue on your current medications as directed. Please refer to the Current Medication list given to you today.   Labwork: -None  Testing/Procedures: Your physician has requested that you have an echocardiogram. Echocardiography is a painless test that uses sound waves to create images of your heart. It provides your doctor with information about the size and shape of your heart and how well your heart's chambers and valves are working. This procedure takes approximately one hour. There are no restrictions for this procedure.  Patient stated will call and schedule echo/HTN/CP.  Follow-Up: Your physician recommends that you schedule a follow-up appointment after patient has echo.  Patient will call office to schedule with Dr. Delton See when patient checks schedule.    Any Other Special Instructions Will Be Listed Below (If Applicable).  Keep track of your bp, make a list and bring in at your next office visit.     If you need a refill on your cardiac medications before your next appointment, please call your pharmacy.

## 2016-10-26 NOTE — Progress Notes (Signed)
Cardiology Office Note    Date:  10/26/2016   ID:  Emma, Taylor [birth year], MRN [id number]  PCP:  Marden Noble, MD  Cardiologist: Dr. Delton See  No chief complaint on file.   History of Present Illness:  Emma Taylor is a 61 y.o. female patient who was seen in the ER 10/14/16 by Dr. Delton See for chest pain. She has a history of hyperlipidemia treated with red yeast Rice, anemia, GERD and anxiety. She presented no one urgent care for evaluation of chest pain over the past 2 weeks and was referred to [facility] for further evaluation. 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 No history of hypertension, diabetes, nonsmoker and no family history of CAD. Troponins were negative EKG had nonspecific ST-T wave changes with prolonged QTC of 551. Chest pain was felt to be atypical. Cardiac CT for calcium scoring and morphology with FFR was offered but she had a reaction to IV contrast may years ago and declined that test. She agreed to an outpatient nuclear stress test which was ordered but she canceled this as well.  Patient is here for follow-up the nuclear stress test that she didn't have. She is asking [place] to clear her for family leave act. She only has chest pain if she doesn't sleep or is under stress. It associated with palpitations and panic attacks and anxiety. She says she didn't have a nuclear stress test because she gets too stressed out with an IV. Blood pressure is elevated today but it's never been high. She says she only slept 2 hours last night and status very anxious.    Past Medical History:  Diagnosis Date  . Anemia   . Anxiety   . GERD (gastroesophageal reflux disease)   . Hyperlipidemia     Past Surgical History:  Procedure Laterality Date  . CESAREAN SECTION      Current Medications: Current Meds  Medication Sig  . aspirin 81 MG chewable tablet Chew 1 tablet (81 mg  total) by mouth daily.  . cholecalciferol (VITAMIN D) 1000 units tablet Take 1,000 Units by mouth daily.  . magnesium 30 MG tablet Take 30 mg by mouth 2 (two) times daily.  . pantoprazole (PROTONIX) 20 MG tablet Take 1 tablet (20 mg total) by mouth daily.  . vitamin C (ASCORBIC ACID) 500 MG tablet Take 500 mg by mouth daily.     Allergies:   Iohexol   Social History   Social History  . Marital status: Married    Spouse name: N/A  . Number of children: N/A  . Years of education: N/A   Occupational History  . IV nurse [employer]   Social History Main Topics  . Smoking status: Never Smoker  . Smokeless tobacco: Never Used  . Alcohol use No  . Drug use: No  . Sexual activity: Not on file   Other Topics Concern  . Not on file   Social History Narrative  . No narrative on file     Family History:  The patient's family history includes Alzheimer's disease in her mother; Breast cancer in her sister; Diabetes in her mother; Hypertension in her mother; Lung cancer in her sister; Pancreatic cancer in her brother; Stomach cancer in her father; Stroke in her sister.   ROS:   Please see the history of present illness.    Review of Systems  Constitution: Negative.  HENT:  Negative.   Eyes: Negative.   Cardiovascular: Positive for chest pain.  Respiratory: Negative.   Hematologic/Lymphatic: Negative.   Musculoskeletal: Negative.  Negative for joint pain.  Gastrointestinal: Negative.   Genitourinary: Negative.   Neurological: Negative.   Psychiatric/Behavioral: The patient is nervous/anxious.    All other systems reviewed and are negative.   PHYSICAL EXAM:   VS:  BP (!) 180/90 (BP Location: Left Arm, Patient Position: Sitting, Cuff Size: Normal)   Pulse 81   Ht 4\' 10"  (1.473 m)   Wt 97 lb 12.8 oz (44.4 kg)   SpO2 100% Comment: at rest  BMI 20.44 kg/m   Physical Exam  GEN: Well nourished, well developed, in no acute distress  Neck: no JVD, carotid bruits, or  masses Cardiac:RRR; no murmurs, rubs, or gallops  Respiratory:  clear to auscultation bilaterally, normal work of breathing GI: soft, nontender, nondistended, + BS Ext: without cyanosis, clubbing, or edema, Good distal pulses bilaterally Neuro:  Alert and Oriented x 3 Psych: Anxious  Wt Readings from Last 3 Encounters:  10/26/16 97 lb 12.8 oz (44.4 kg)  10/14/16 97 lb (44 kg)      Studies/Labs Reviewed:   EKG:  EKG is not ordered today.  EKG reviewed from 10/15/16 normal sinus rhythm with nonspecific ST-T wave changes  Recent Labs: 10/14/2016: ALT 28; BUN 9; Creatinine, Ser 0.63; Hemoglobin 14.6; Platelets 281; Potassium 3.9; Sodium 141   Lipid Panel No results found for: CHOL, TRIG, HDL, CHOLHDL, VLDL, LDLCALC, LDLDIRECT  Additional studies/ records that were reviewed today include:      ASSESSMENT:    1. Chest pain, unspecified type   2. Essential hypertension   3. Hyperlipidemia, unspecified hyperlipidemia type   4. Anxiety   5. Gastroesophageal reflux disease, esophagitis presence not specified      PLAN:  In order of problems listed above:  Chest pain atypical and associated with panic attacks anxiety and lack of sleep. Blood pressure is elevated today and she has hyperlipidemia. She asked for a 2-D echo but refuses to schedule it. She is refusing nuclear stress test because of an IV. Not sure GXT would be valuable in this patient with nonspecific ST-T wave changes on her EKG. She is asking for [place] to give her time off. I told her she needs to check with Dr. Kevan Ny for treatment of her anxiety as we have not diagnosed any cardiac problems to warrant this. Schedule echo and nuclear stress test if she is agreeable and follow-up with Dr. Delton See.  Essential hypertension patient's blood pressure is elevated today but she has never been diagnosed with hypertension. She will take her blood pressures at home and call with results.  Hyperlipidemia LDL 117 cholesterol 206 she  takes red rice yeast  Anxiety with panic attack seems to be the root of her problem right now. Recommend she see Dr. Kevan Ny for this.  GERD on Protonix    Medication Adjustments/Labs and Tests Ordered: Current medicines are reviewed at length with the patient today.  Concerns regarding medicines are outlined above.  Medication changes, Labs and Tests ordered today are listed in the Patient Instructions below. There are no Patient Instructions on file for this visit.   Signed, Jacolyn Reedy, PA-C  10/26/2016 10:16 AM    [facility] [address] [place], [place], [place]  [address] Phone: [phone]; Fax: [phone]

## 2016-10-28 ENCOUNTER — Telehealth (HOSPITAL_COMMUNITY): Payer: Self-pay | Admitting: Physician Assistant

## 2016-11-03 DIAGNOSIS — I10 Essential (primary) hypertension: Secondary | ICD-10-CM | POA: Diagnosis not present

## 2016-11-03 DIAGNOSIS — F418 Other specified anxiety disorders: Secondary | ICD-10-CM | POA: Diagnosis not present

## 2016-11-03 DIAGNOSIS — R0789 Other chest pain: Secondary | ICD-10-CM | POA: Diagnosis not present

## 2016-11-03 MED FILL — LOSARTAN POTASSIUM 25 MG TA: 25 | 30 days supply | Qty: 30 | Fill #0

## 2016-11-03 MED FILL — SERTRALINE HCL 25 MG TABLET: 25 | 30 days supply | Qty: 30 | Fill #0

## 2016-11-04 NOTE — Telephone Encounter (Signed)
On 10/19/16 patient refused to have test. She will be removed from the workqueue.

## 2016-12-10 DIAGNOSIS — R0789 Other chest pain: Secondary | ICD-10-CM | POA: Diagnosis not present

## 2016-12-10 DIAGNOSIS — F418 Other specified anxiety disorders: Secondary | ICD-10-CM | POA: Diagnosis not present

## 2016-12-10 DIAGNOSIS — I10 Essential (primary) hypertension: Secondary | ICD-10-CM | POA: Diagnosis not present

## 2016-12-10 MED FILL — LOSARTAN POTASSIUM 25 MG TA: 25 | 90 days supply | Qty: 90 | Fill #0

## 2017-02-22 MED FILL — LOSARTAN POTASSIUM 25 MG TA: 25 | 90 days supply | Qty: 90 | Fill #1

## 2017-04-22 MED FILL — traZODone HCL 50 MG TABS: 50 | 30 days supply | Qty: 30 | Fill #0

## 2017-04-22 MED FILL — LOSARTAN-HCTZ 50-12.5 MG TA: 50-12.5 | 30 days supply | Qty: 30 | Fill #0

## 2018-06-27 MED FILL — BENZONATATE 100 MG CAPS: 100 | 30 days supply | Qty: 60 | Fill #0

## 2018-07-07 ENCOUNTER — Other Ambulatory Visit: Payer: Self-pay | Admitting: Geriatric Medicine

## 2018-07-07 ENCOUNTER — Ambulatory Visit
Admission: RE | Admit: 2018-07-07 | Discharge: 2018-07-07 | Disposition: A | Discharge status: Home / Self Care | Payer: No Typology Code available for payment source | Source: Ambulatory Visit | Attending: Geriatric Medicine | Admitting: Geriatric Medicine

## 2018-07-07 DIAGNOSIS — R059 Cough, unspecified: Secondary | ICD-10-CM

## 2018-07-07 DIAGNOSIS — R05 Cough: Secondary | ICD-10-CM

## 2018-10-19 IMAGING — CT CT HEAD W/O CM
4 series · 17 of 47 positions shown, 19 images · non-contrast
Comparison: None.

CLINICAL DATA: Headache. Patient's family reports history of brain
tumor and stroke.

EXAM:
CT HEAD WITHOUT CONTRAST
TECHNIQUE: Contiguous axial images were obtained from the base of the skull
through the vertex without intravenous contrast.

[Series 3: head without · axial · non-contrast · 0.41mm/px · z∈[-95,+25]mm · 7 of 33 slices shown, 9 images]
[im 5/33  brain]
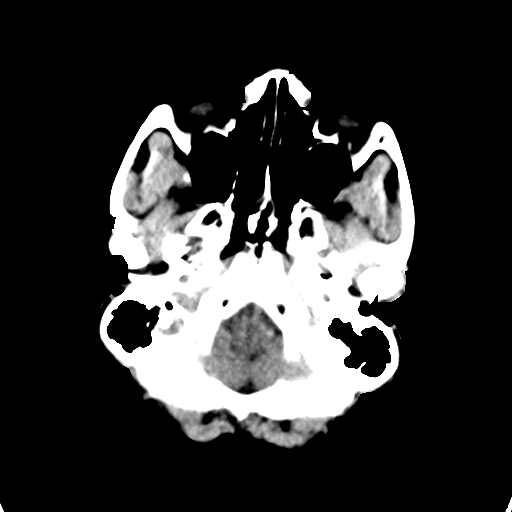
[im 5/33  bone]
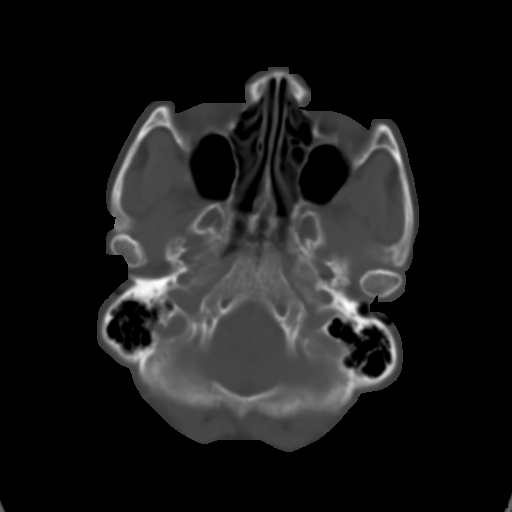
[im 9/33  brain]
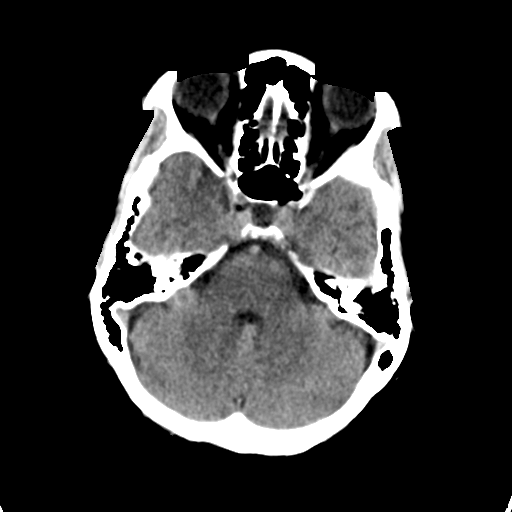
[im 13/33  brain]
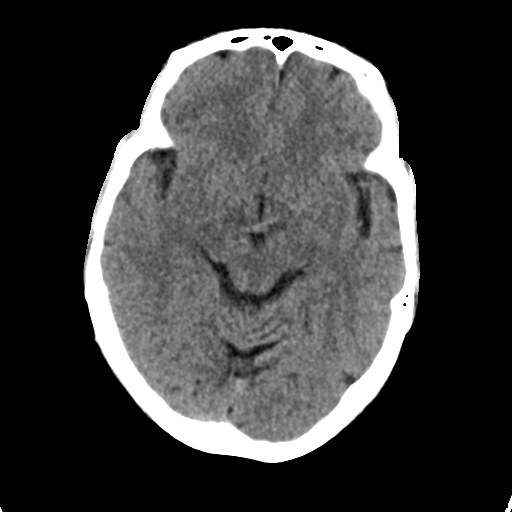
[im 17/33  brain]
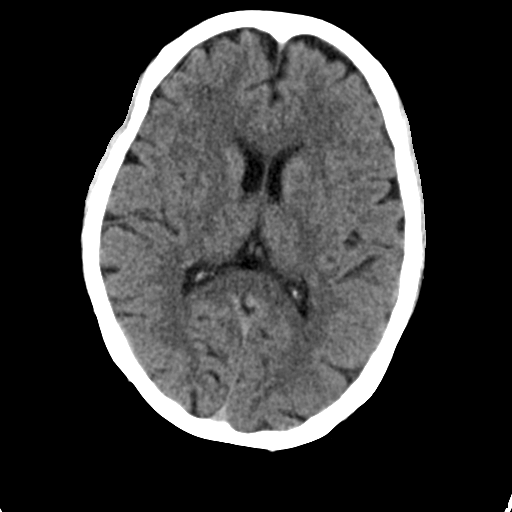
[im 21/33  brain]
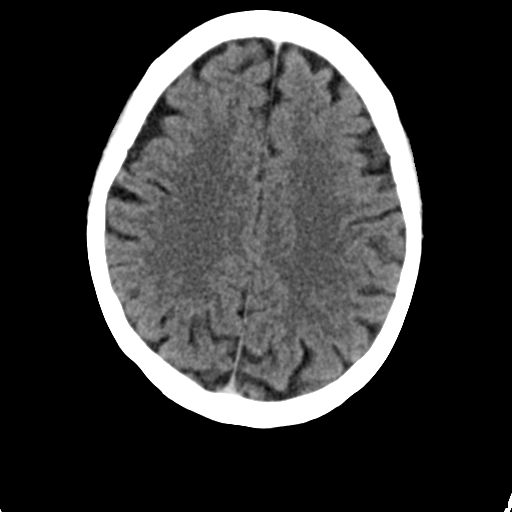
[im 21/33  bone]
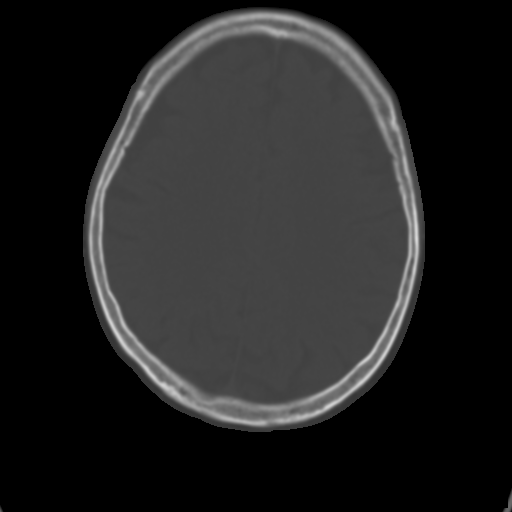
[im 25/33  brain]
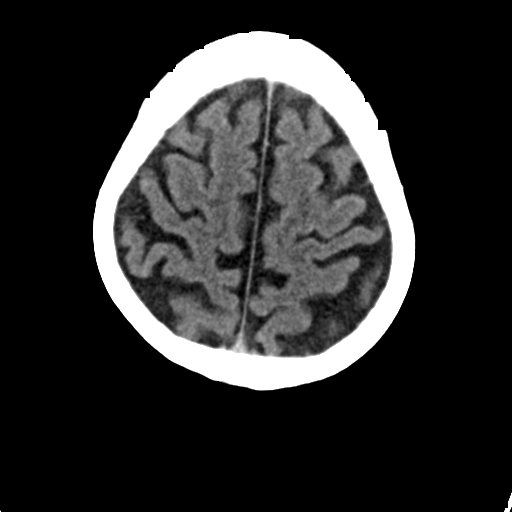
[im 29/33  brain]
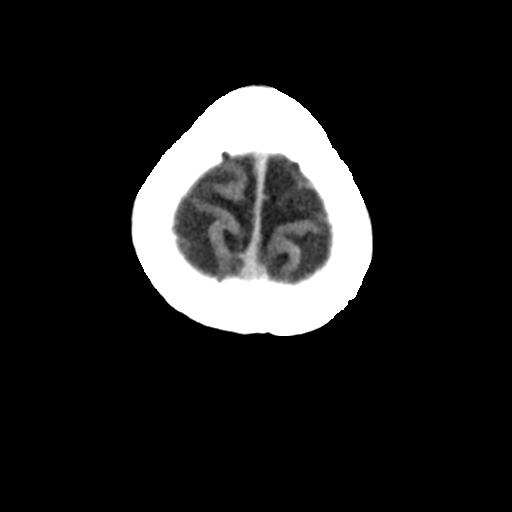

[Series 4: head bone · axial · 0.41mm/px · z∈[-99,-43]mm · 4 of 82 slices shown]
[im 9/82  bone]
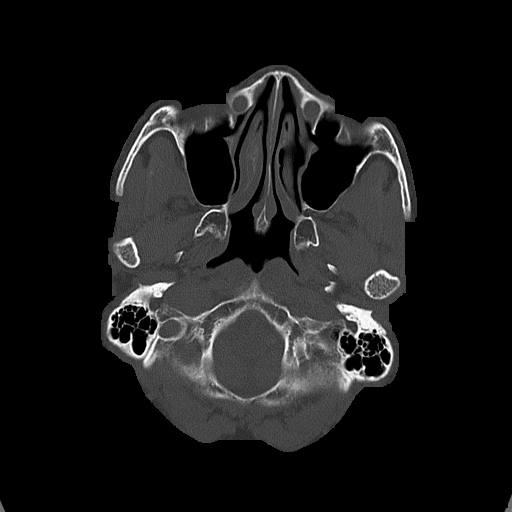
[im 17/82  bone]
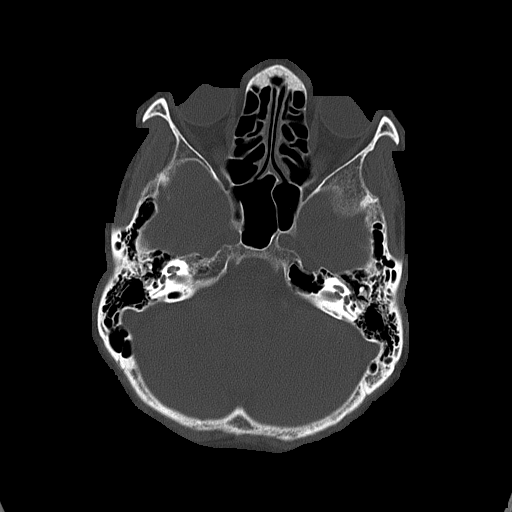
[im 25/82  bone]
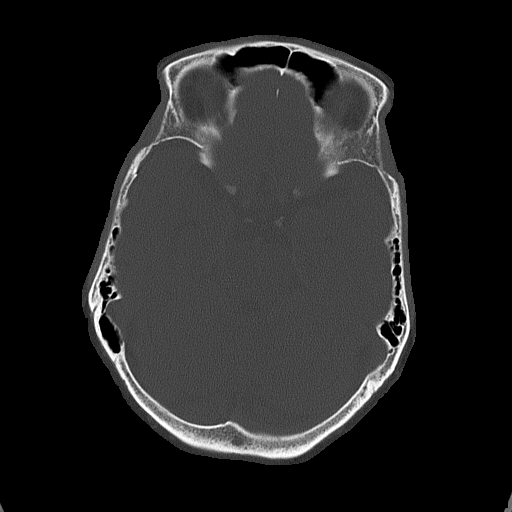
[im 37/82  bone]
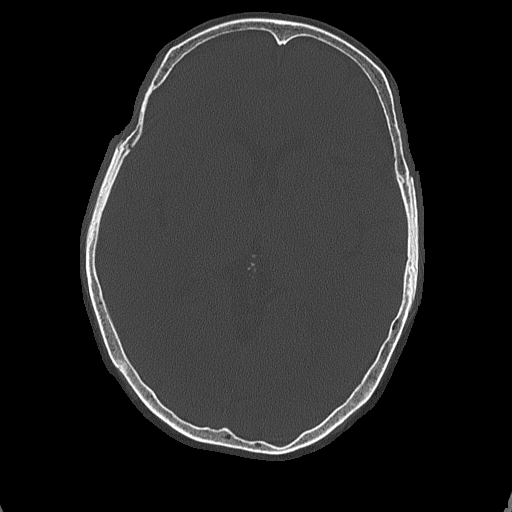

[Series 5: head without cor · coronal · non-contrast · 0.28mm/px · 3 of 67 slices shown]
[im 23/67  brain]
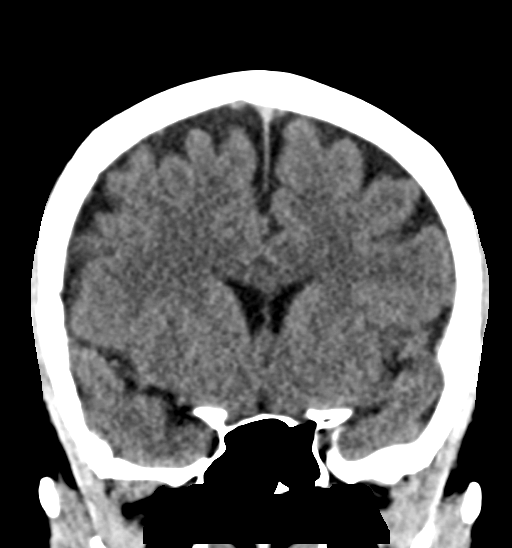
[im 30/67  brain]
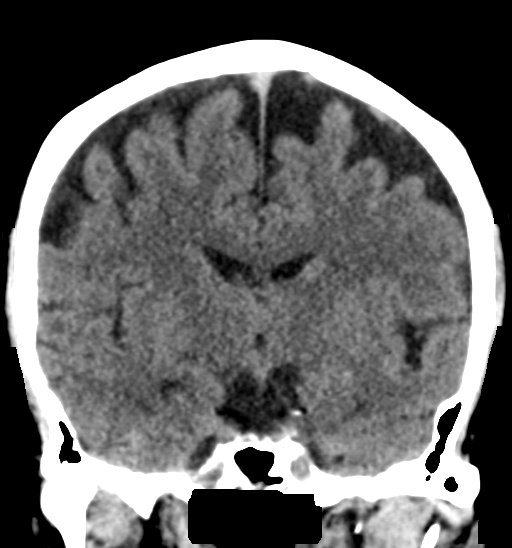
[im 37/67  brain]
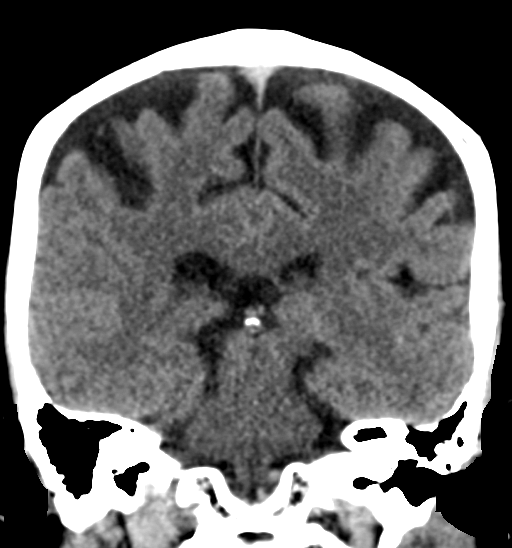

[Series 6: head without sag · sagittal · non-contrast · 0.32mm/px · 3 of 51 slices shown]
[im 17/51  brain]
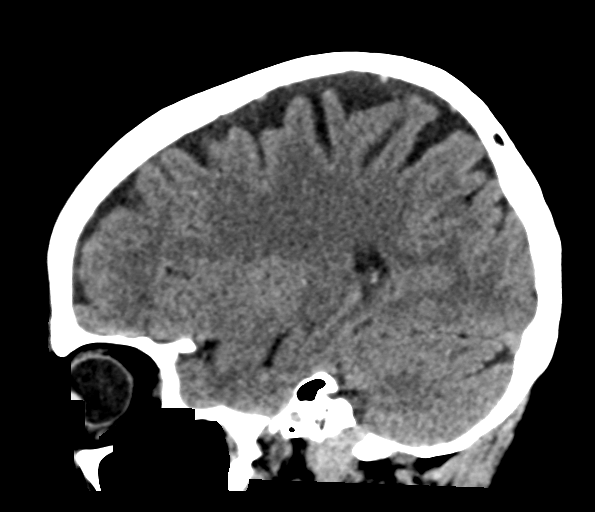
[im 26/51  brain]
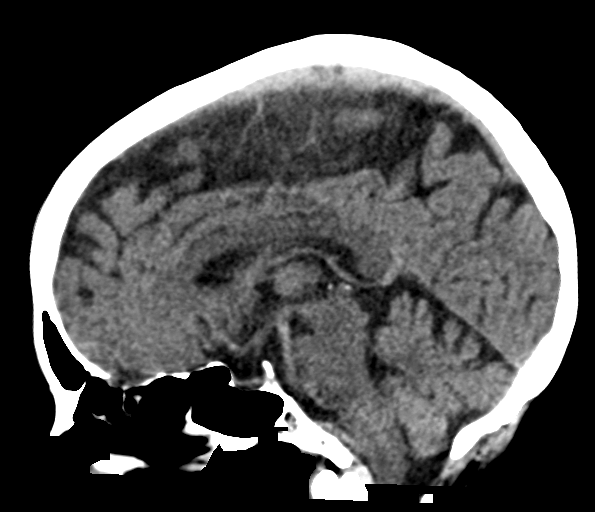
[im 34/51  brain]
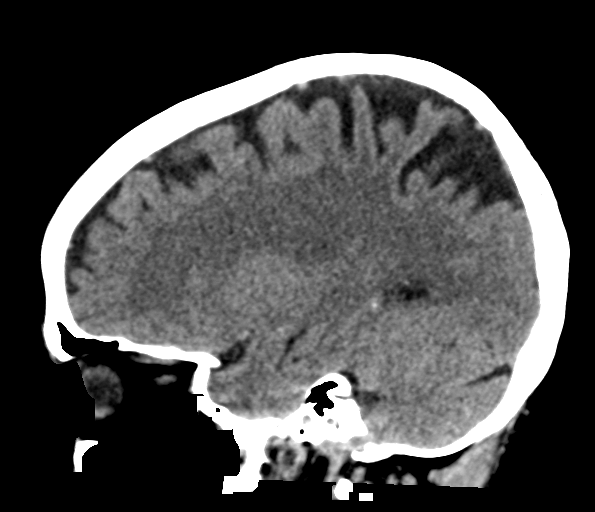

[17 of 47 positions shown; findings below may reference images not displayed]

FINDINGS: Brain: No evidence of acute infarction, hemorrhage, hydrocephalus,
extra-axial collection or mass lesion/mass effect. Generalized
atrophy, normal for age. Mild chronic small vessel ischemia.
Scattered basal gangliar calcifications.

Vascular: No hyperdense vessel or unexpected calcification.

Skull: No fracture or focal lesion. Scattered venous lakes in the
calvarium.

Sinuses/Orbits: Paranasal sinuses and mastoid air cells are clear.
The visualized orbits are unremarkable.

Other: None.
IMPRESSION: No acute intracranial abnormality.

## 2020-07-08 DIAGNOSIS — H25813 Combined forms of age-related cataract, bilateral: Secondary | ICD-10-CM | POA: Diagnosis not present

## 2020-07-08 DIAGNOSIS — H40053 Ocular hypertension, bilateral: Secondary | ICD-10-CM | POA: Diagnosis not present

## 2020-10-11 DIAGNOSIS — G5602 Carpal tunnel syndrome, left upper limb: Secondary | ICD-10-CM | POA: Diagnosis not present

## 2020-10-11 DIAGNOSIS — I10 Essential (primary) hypertension: Secondary | ICD-10-CM | POA: Diagnosis not present

## 2020-10-11 DIAGNOSIS — E785 Hyperlipidemia, unspecified: Secondary | ICD-10-CM | POA: Diagnosis not present

## 2020-10-11 DIAGNOSIS — Z0000 Encounter for general adult medical examination without abnormal findings: Secondary | ICD-10-CM | POA: Diagnosis not present

## 2020-10-11 DIAGNOSIS — R7303 Prediabetes: Secondary | ICD-10-CM | POA: Diagnosis not present

## 2020-10-11 DIAGNOSIS — Z1211 Encounter for screening for malignant neoplasm of colon: Secondary | ICD-10-CM | POA: Diagnosis not present

## 2020-10-11 DIAGNOSIS — E559 Vitamin D deficiency, unspecified: Secondary | ICD-10-CM | POA: Diagnosis not present

## 2020-10-11 DIAGNOSIS — F330 Major depressive disorder, recurrent, mild: Secondary | ICD-10-CM | POA: Diagnosis not present

## 2020-10-23 DIAGNOSIS — Z1211 Encounter for screening for malignant neoplasm of colon: Secondary | ICD-10-CM | POA: Diagnosis not present

## 2021-01-08 DIAGNOSIS — H25813 Combined forms of age-related cataract, bilateral: Secondary | ICD-10-CM | POA: Diagnosis not present

## 2021-01-08 DIAGNOSIS — H40053 Ocular hypertension, bilateral: Secondary | ICD-10-CM | POA: Diagnosis not present

## 2021-04-08 DIAGNOSIS — R519 Headache, unspecified: Secondary | ICD-10-CM | POA: Diagnosis not present

## 2021-04-08 DIAGNOSIS — R059 Cough, unspecified: Secondary | ICD-10-CM | POA: Diagnosis not present

## 2021-07-07 DIAGNOSIS — H40053 Ocular hypertension, bilateral: Secondary | ICD-10-CM | POA: Diagnosis not present

## 2021-07-07 DIAGNOSIS — H25813 Combined forms of age-related cataract, bilateral: Secondary | ICD-10-CM | POA: Diagnosis not present

## 2021-11-11 DIAGNOSIS — Z0000 Encounter for general adult medical examination without abnormal findings: Secondary | ICD-10-CM | POA: Diagnosis not present

## 2021-11-11 DIAGNOSIS — F330 Major depressive disorder, recurrent, mild: Secondary | ICD-10-CM | POA: Diagnosis not present

## 2021-11-11 DIAGNOSIS — I10 Essential (primary) hypertension: Secondary | ICD-10-CM | POA: Diagnosis not present

## 2021-11-11 DIAGNOSIS — Z1211 Encounter for screening for malignant neoplasm of colon: Secondary | ICD-10-CM | POA: Diagnosis not present

## 2021-11-11 DIAGNOSIS — E559 Vitamin D deficiency, unspecified: Secondary | ICD-10-CM | POA: Diagnosis not present

## 2021-11-11 DIAGNOSIS — Z7722 Contact with and (suspected) exposure to environmental tobacco smoke (acute) (chronic): Secondary | ICD-10-CM | POA: Diagnosis not present

## 2021-11-11 DIAGNOSIS — R7303 Prediabetes: Secondary | ICD-10-CM | POA: Diagnosis not present

## 2021-11-11 DIAGNOSIS — G5602 Carpal tunnel syndrome, left upper limb: Secondary | ICD-10-CM | POA: Diagnosis not present

## 2021-11-11 DIAGNOSIS — E785 Hyperlipidemia, unspecified: Secondary | ICD-10-CM | POA: Diagnosis not present

## 2022-01-05 DIAGNOSIS — H40053 Ocular hypertension, bilateral: Secondary | ICD-10-CM | POA: Diagnosis not present

## 2022-01-05 DIAGNOSIS — H25813 Combined forms of age-related cataract, bilateral: Secondary | ICD-10-CM | POA: Diagnosis not present

## 2022-05-01 DIAGNOSIS — Z1211 Encounter for screening for malignant neoplasm of colon: Secondary | ICD-10-CM | POA: Diagnosis not present

## 2022-06-09 DIAGNOSIS — F330 Major depressive disorder, recurrent, mild: Secondary | ICD-10-CM | POA: Diagnosis not present

## 2022-06-09 DIAGNOSIS — E785 Hyperlipidemia, unspecified: Secondary | ICD-10-CM | POA: Diagnosis not present

## 2022-06-09 DIAGNOSIS — D509 Iron deficiency anemia, unspecified: Secondary | ICD-10-CM | POA: Diagnosis not present

## 2022-06-09 DIAGNOSIS — R7309 Other abnormal glucose: Secondary | ICD-10-CM | POA: Diagnosis not present

## 2022-06-09 DIAGNOSIS — R7303 Prediabetes: Secondary | ICD-10-CM | POA: Diagnosis not present

## 2022-07-13 DIAGNOSIS — H25813 Combined forms of age-related cataract, bilateral: Secondary | ICD-10-CM | POA: Diagnosis not present

## 2022-07-13 DIAGNOSIS — H40053 Ocular hypertension, bilateral: Secondary | ICD-10-CM | POA: Diagnosis not present

## 2022-11-25 DIAGNOSIS — E785 Hyperlipidemia, unspecified: Secondary | ICD-10-CM | POA: Diagnosis not present

## 2022-11-25 DIAGNOSIS — D509 Iron deficiency anemia, unspecified: Secondary | ICD-10-CM | POA: Diagnosis not present

## 2022-11-25 DIAGNOSIS — R7303 Prediabetes: Secondary | ICD-10-CM | POA: Diagnosis not present

## 2022-11-25 DIAGNOSIS — Z0000 Encounter for general adult medical examination without abnormal findings: Secondary | ICD-10-CM | POA: Diagnosis not present

## 2022-11-25 DIAGNOSIS — Z1211 Encounter for screening for malignant neoplasm of colon: Secondary | ICD-10-CM | POA: Diagnosis not present

## 2022-11-25 DIAGNOSIS — E559 Vitamin D deficiency, unspecified: Secondary | ICD-10-CM | POA: Diagnosis not present

## 2023-01-19 DIAGNOSIS — H25813 Combined forms of age-related cataract, bilateral: Secondary | ICD-10-CM | POA: Diagnosis not present

## 2023-01-19 DIAGNOSIS — H40053 Ocular hypertension, bilateral: Secondary | ICD-10-CM | POA: Diagnosis not present

## 2023-06-07 DIAGNOSIS — Z1211 Encounter for screening for malignant neoplasm of colon: Secondary | ICD-10-CM | POA: Diagnosis not present

## 2023-07-14 DIAGNOSIS — H25813 Combined forms of age-related cataract, bilateral: Secondary | ICD-10-CM | POA: Diagnosis not present

## 2023-07-14 DIAGNOSIS — H40053 Ocular hypertension, bilateral: Secondary | ICD-10-CM | POA: Diagnosis not present

## 2023-09-22 DIAGNOSIS — R5383 Other fatigue: Secondary | ICD-10-CM | POA: Diagnosis not present

## 2023-09-22 DIAGNOSIS — D509 Iron deficiency anemia, unspecified: Secondary | ICD-10-CM | POA: Diagnosis not present

## 2023-09-22 DIAGNOSIS — E559 Vitamin D deficiency, unspecified: Secondary | ICD-10-CM | POA: Diagnosis not present

## 2023-09-22 DIAGNOSIS — E785 Hyperlipidemia, unspecified: Secondary | ICD-10-CM | POA: Diagnosis not present

## 2023-09-22 DIAGNOSIS — R7303 Prediabetes: Secondary | ICD-10-CM | POA: Diagnosis not present

## 2024-01-21 DIAGNOSIS — H25813 Combined forms of age-related cataract, bilateral: Secondary | ICD-10-CM | POA: Diagnosis not present

## 2024-01-21 DIAGNOSIS — H40053 Ocular hypertension, bilateral: Secondary | ICD-10-CM | POA: Diagnosis not present
